# Patient Record
Sex: Female | Born: 1955 | Race: White | Hispanic: No | Marital: Married | Smoking: Current every day smoker
Health system: Southern US, Academic
[De-identification: ages and names within clinical notes are randomized; demographics above are authoritative.]

## PROBLEM LIST (undated history)

## (undated) DIAGNOSIS — J45909 Unspecified asthma, uncomplicated: Secondary | ICD-10-CM

## (undated) DIAGNOSIS — E785 Hyperlipidemia, unspecified: Secondary | ICD-10-CM

## (undated) DIAGNOSIS — I10 Essential (primary) hypertension: Secondary | ICD-10-CM

## (undated) DIAGNOSIS — E119 Type 2 diabetes mellitus without complications: Secondary | ICD-10-CM

## (undated) DIAGNOSIS — F419 Anxiety disorder, unspecified: Secondary | ICD-10-CM

## (undated) HISTORY — DX: Essential (primary) hypertension: I10

## (undated) HISTORY — DX: Type 2 diabetes mellitus without complications: E11.9

## (undated) HISTORY — DX: Anxiety disorder, unspecified: F41.9

## (undated) HISTORY — PX: HX GALL BLADDER SURGERY/CHOLE: SHX55

## (undated) HISTORY — PX: HX APPENDECTOMY: SHX54

## (undated) HISTORY — DX: Unspecified asthma, uncomplicated: J45.909

## (undated) HISTORY — DX: Hyperlipidemia, unspecified: E78.5

## (undated) HISTORY — PX: HX HYSTERECTOMY: SHX81

---

## 1996-12-23 ENCOUNTER — Other Ambulatory Visit (HOSPITAL_COMMUNITY): Payer: Self-pay | Admitting: OBSTETRICS/GYNECOLOGY

## 2021-11-05 ENCOUNTER — Ambulatory Visit (HOSPITAL_COMMUNITY): Payer: Self-pay | Admitting: OPHTHALMOLOGY

## 2022-08-27 ENCOUNTER — Other Ambulatory Visit (HOSPITAL_COMMUNITY): Payer: Self-pay | Admitting: Family Medicine

## 2022-08-27 DIAGNOSIS — Z1239 Encounter for other screening for malignant neoplasm of breast: Secondary | ICD-10-CM

## 2022-09-06 ENCOUNTER — Other Ambulatory Visit: Payer: Self-pay

## 2022-09-06 ENCOUNTER — Encounter (HOSPITAL_COMMUNITY): Payer: Self-pay

## 2022-09-06 ENCOUNTER — Inpatient Hospital Stay
Admission: RE | Admit: 2022-09-06 | Discharge: 2022-09-06 | Disposition: A | Discharge status: Home / Self Care | Payer: Medicare Other | Source: Ambulatory Visit | Attending: Family Medicine | Admitting: Family Medicine

## 2022-09-06 DIAGNOSIS — Z1239 Encounter for other screening for malignant neoplasm of breast: Secondary | ICD-10-CM

## 2022-09-06 DIAGNOSIS — Z1231 Encounter for screening mammogram for malignant neoplasm of breast: Secondary | ICD-10-CM | POA: Insufficient documentation

## 2023-01-17 IMAGING — DX XRAY SHOULDER MINIMUM 2 VIEW LT
1 series · 2 of 2 positions shown · non-contrast
Comparison: None available.

﻿EXAM:  [id number]   XRAY SHOULDER MINIMUM 2 VIEW RT,XRAY SHOULDER MINIMUM 2 VIEW LT
INDICATION: Bilateral shoulder pain.

[Series 1: apobl · 0.14mm/px · 2 of 2 slices shown]
[im 1/2]
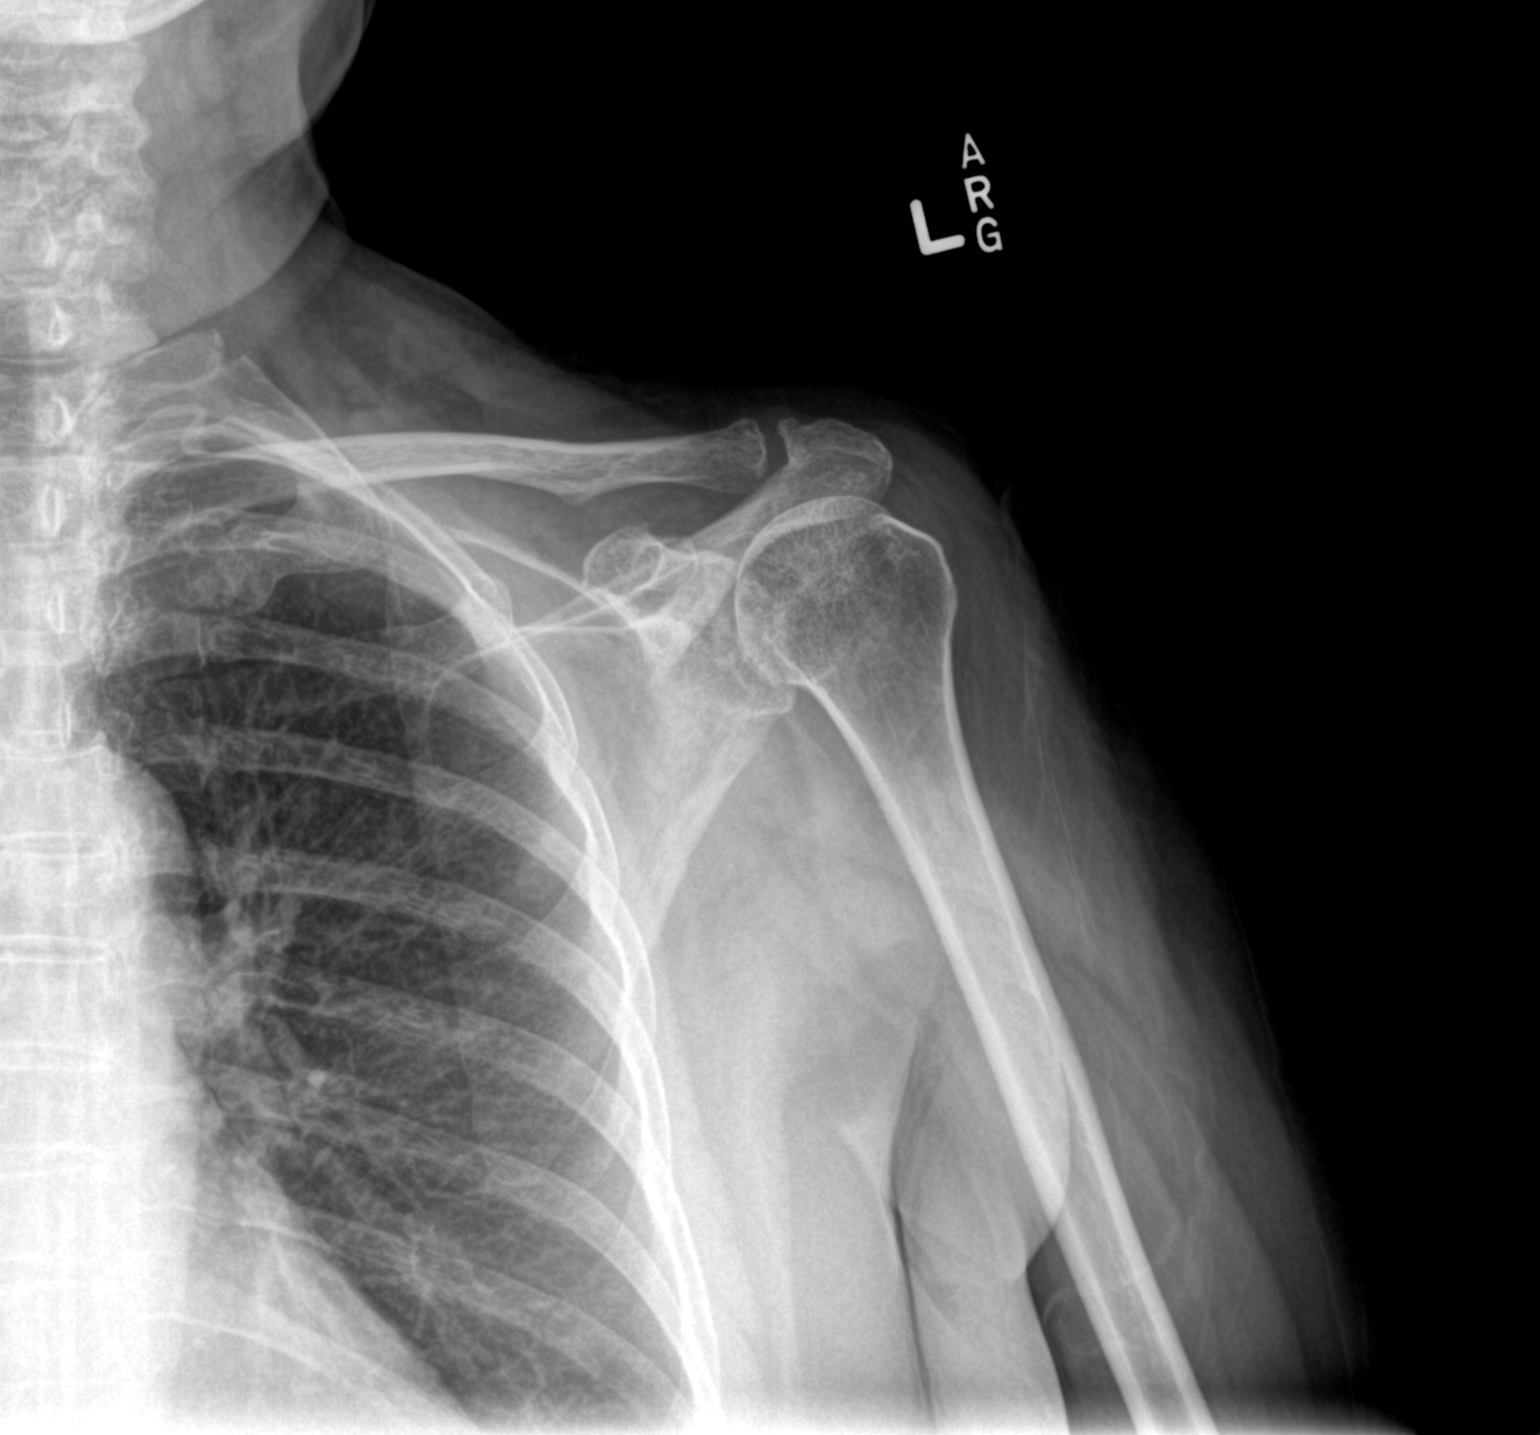
[im 2/2]
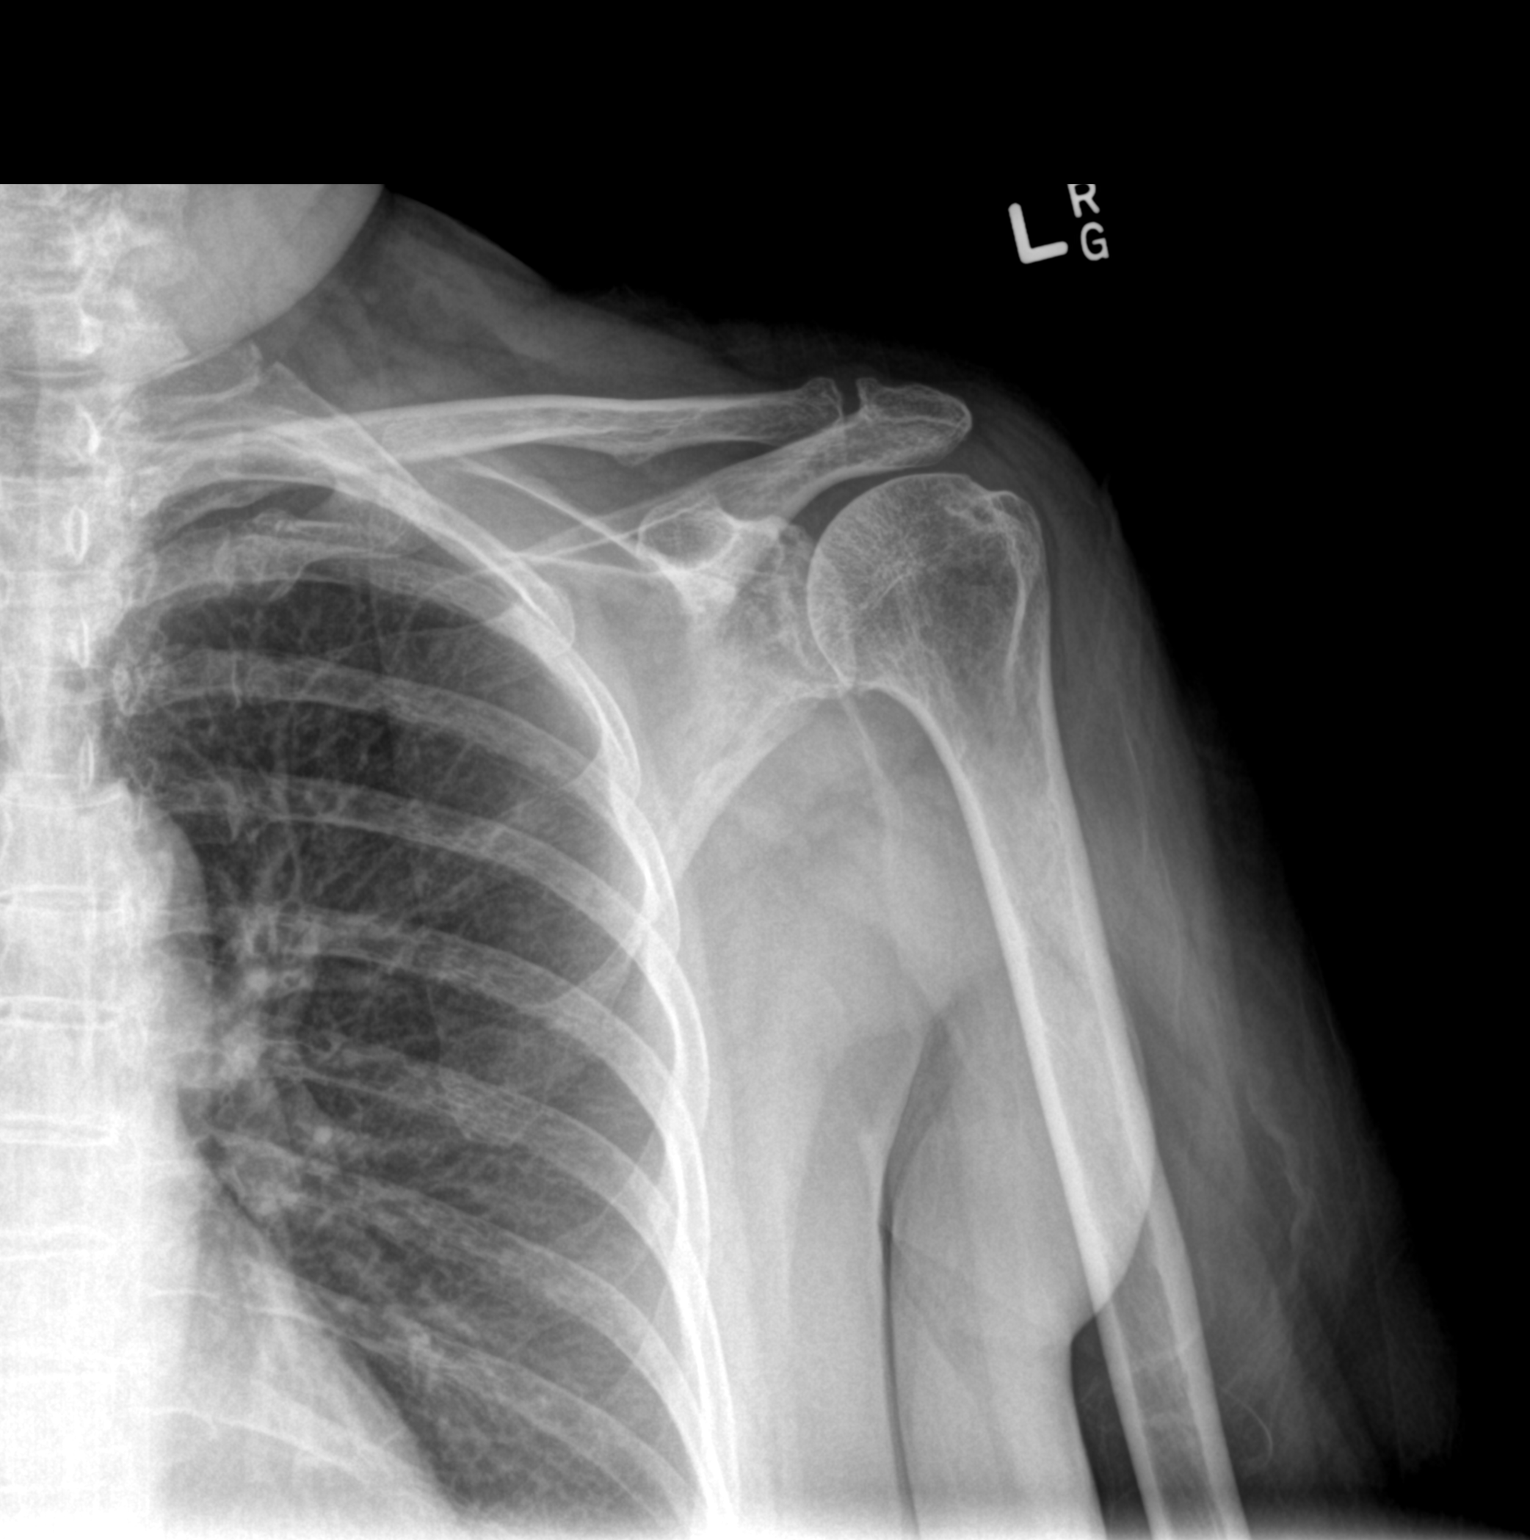

[2 of 2 positions shown; findings below may reference images not displayed]

FINDINGS: Suspected osteopenic bones.  Osteoarthritis of the glenohumeral joints of moderate degree is noted on both sides.  Mild osteoarthritis of acromioclavicular joints.  Soft tissues are normal.
IMPRESSION: 1. Suspected osteopenic bones. Please correlate with DEXA densitometry.

2. Osteoarthritis with degenerative changes of glenohumeral joints of moderately significant degree of both sides.   Mild osteoarthritis of AC joints on both sides.

## 2023-01-17 IMAGING — MR MRI SHOULDER RT W/O CONTRAST
7 of 8 series · 31 of 40 positions shown · IV contrast (gadolinium)
Comparison: Right shoulder radiograph of same date.

﻿EXAM:  [id number]   MRI SHOULDER RT W/O CONTRAST
INDICATION: Bilateral shoulder pain.  No history of shoulder surgery.
TECHNIQUE: Multiplanar multisequential MRI of the right shoulder was performed without gadolinium contrast.

[Series 6: T1 · oblique · right · 4.0mm · 0.31mm/px · 5 of 22 slices shown]
[im 1/22]
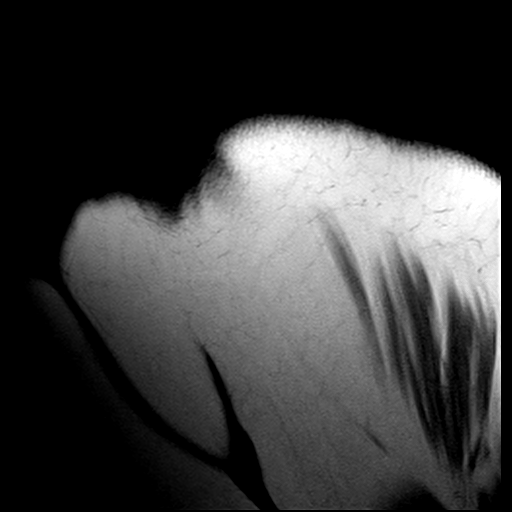
[im 6/22]
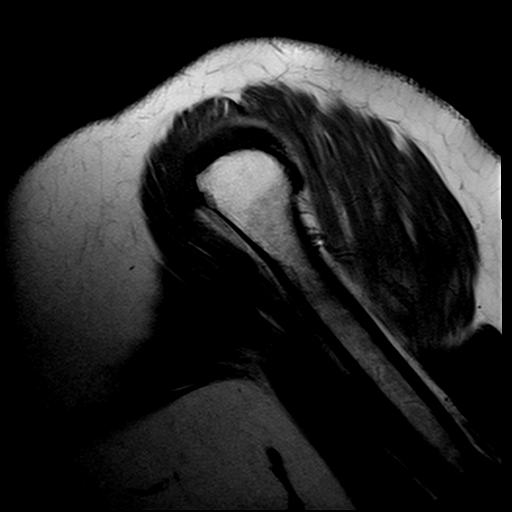
[im 11/22]
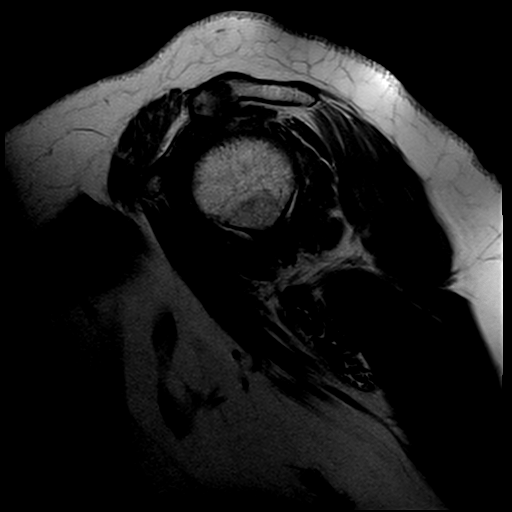
[im 16/22]
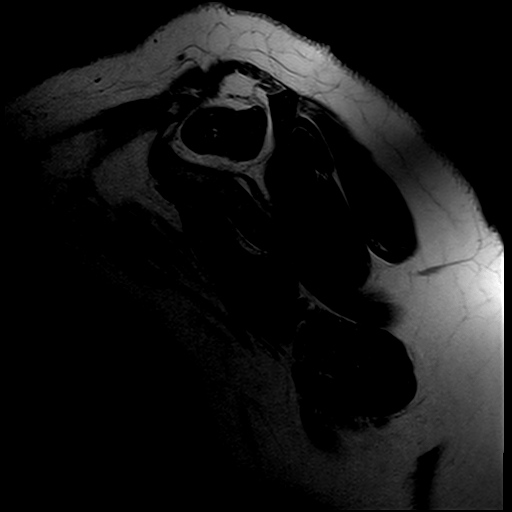
[im 22/22]
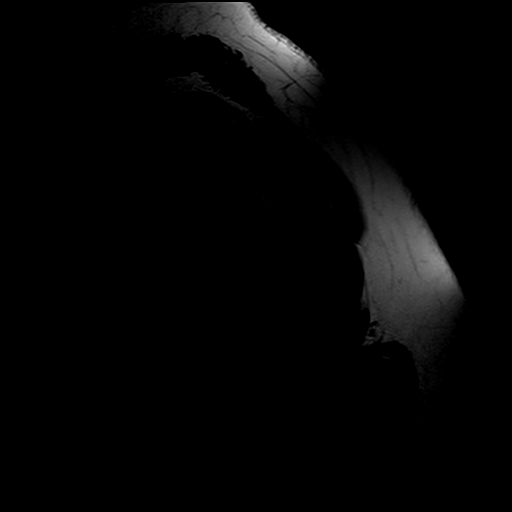

[Series 7: T2 · oblique · right · 4.0mm · 0.42mm/px · 5 of 22 slices shown]
[im 1/22]
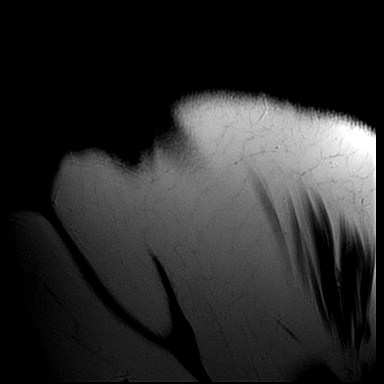
[im 6/22]
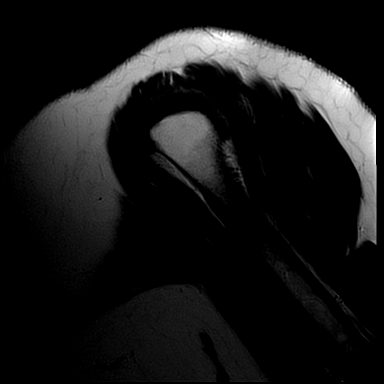
[im 11/22]
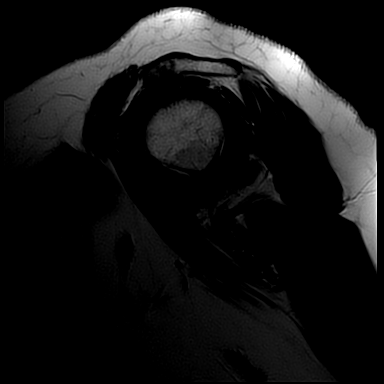
[im 16/22]
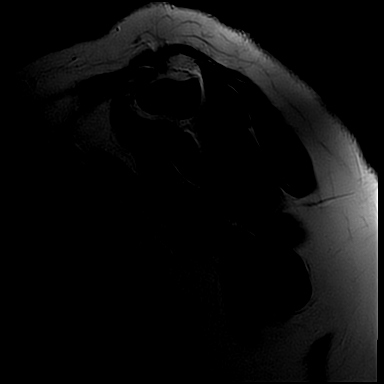
[im 22/22]
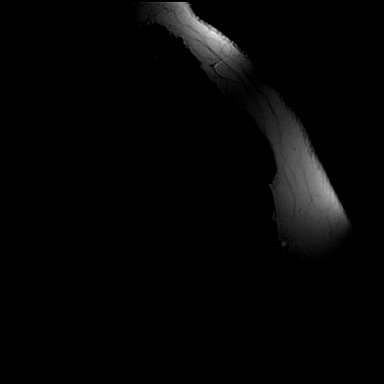

[Series 8: STIR · oblique · right · 4.0mm · 0.36mm/px · 1 of 22 slices shown]
[im 1/22]
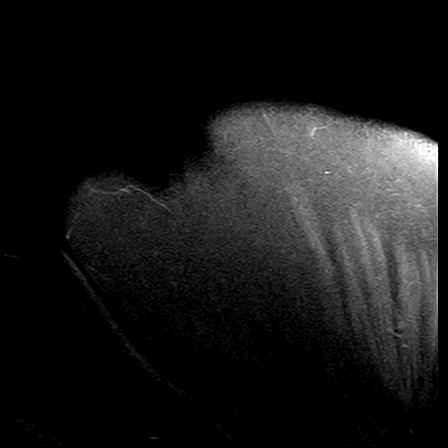

[Series 9: PD fat-sat · axial · right · 4.0mm · 0.36mm/px · z∈[-23,+87]mm · 5 of 24 slices shown (1 of 3)]
[im 1/24]
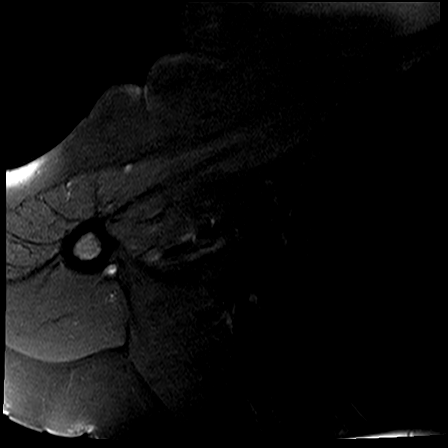
[im 6/24]
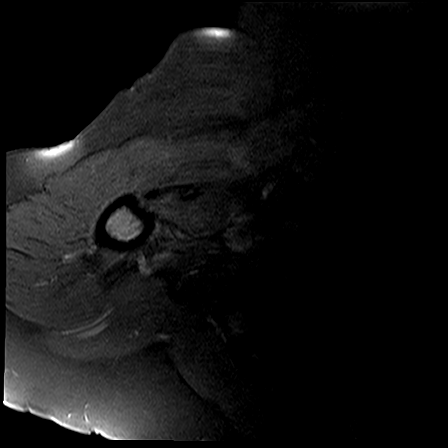
[im 12/24]
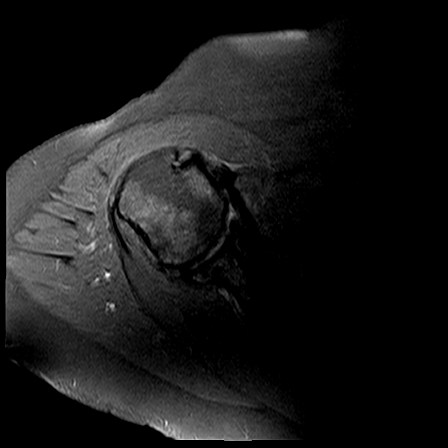
[im 18/24]
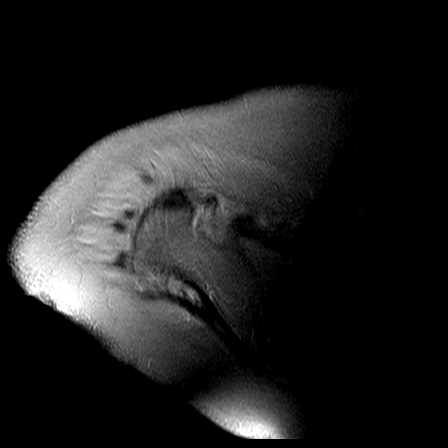
[im 24/24]
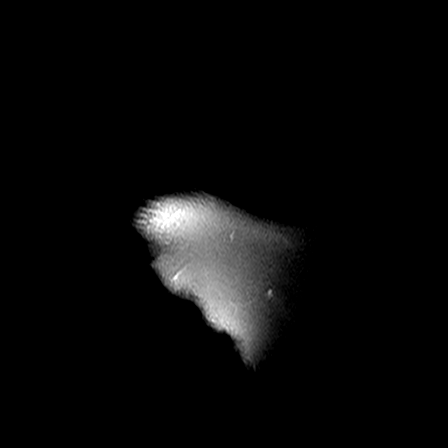

[Series 10: T2 fat-sat · axial · right · 4.0mm · 0.42mm/px · z∈[-23,+87]mm · 5 of 24 slices shown]
[im 1/24]
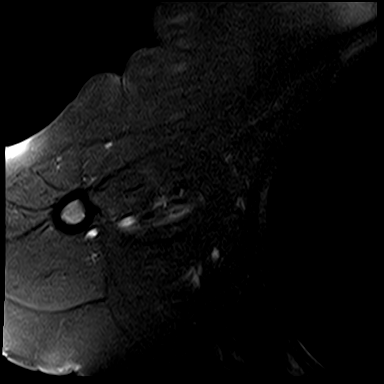
[im 6/24]
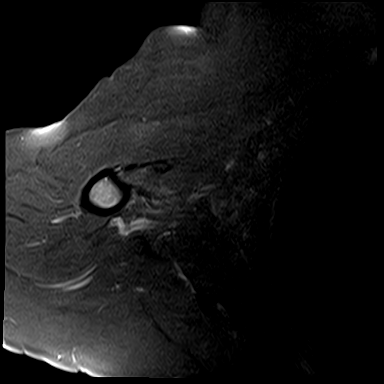
[im 12/24]
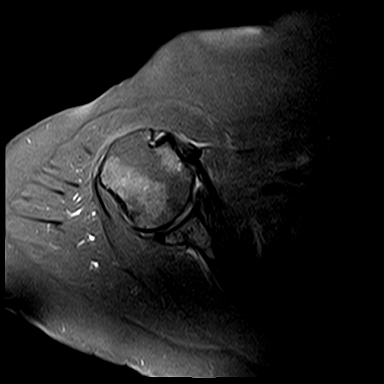
[im 18/24]
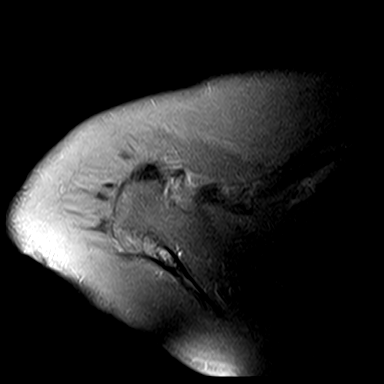
[im 24/24]
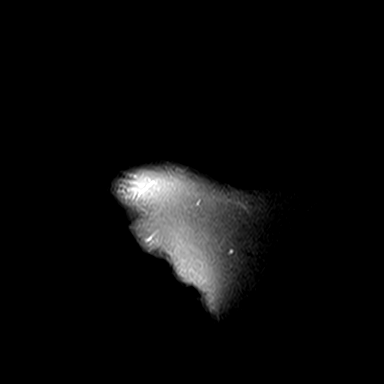

[Series 11: PD fat-sat · oblique · right · 4.0mm · 0.47mm/px · 5 of 23 slices shown (2 of 3)]
[im 1/23]
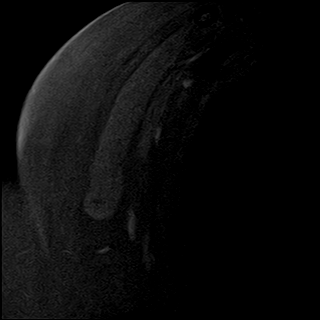
[im 6/23]
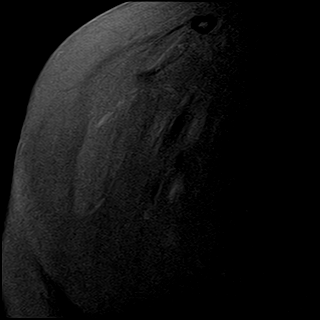
[im 12/23]
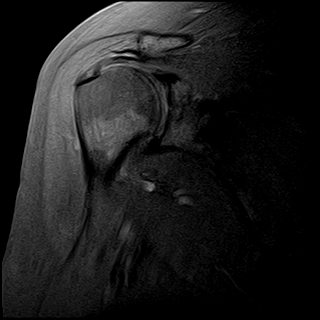
[im 17/23]
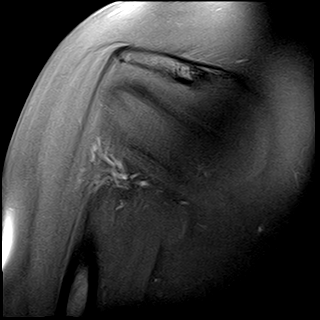
[im 23/23]
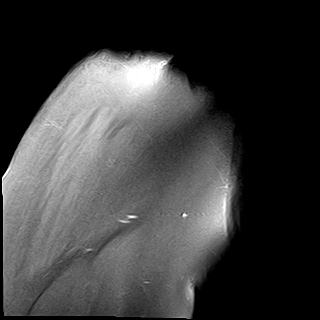

[Series 13: PD fat-sat · axial · right · 4.0mm · 0.50mm/px · z∈[-23,+87]mm · 5 of 24 slices shown (3 of 3)]
[im 1/24]
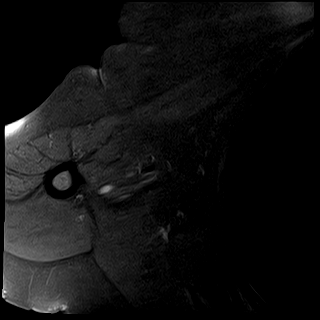
[im 6/24]
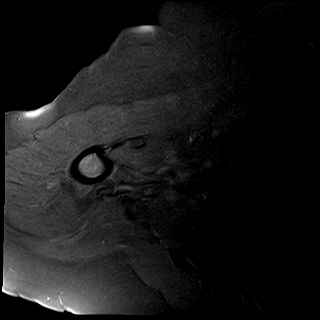
[im 12/24]
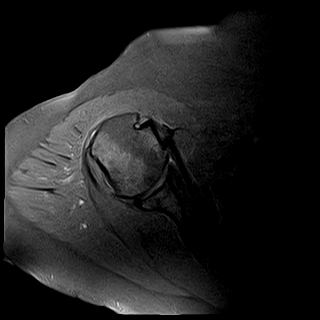
[im 18/24]
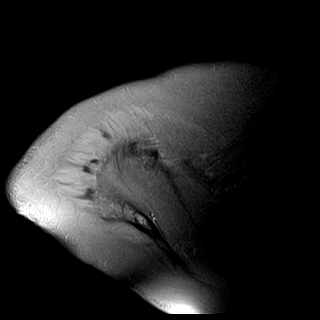
[im 24/24]
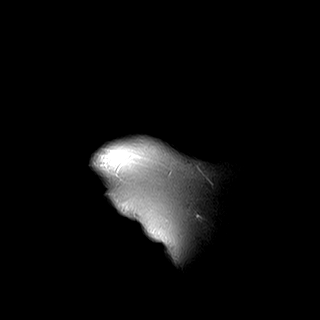

[31 of 40 positions shown; findings below may reference images not displayed]

FINDINGS: No acute bony lesions are seen at the right shoulder.  Degenerative arthritis of significant degree involving glenohumeral joint is noted with degenerative changes of glenoid labrum particularly the anterior lip of the glenoid labrum with small paralabral cyst.  No evidence of displaced tear of the labrum is seen.  

Long head of biceps tendon is intact.  

Degenerative changes of AC joint mildly impinging on subacromian space.  Mild supraspinatus tendinitis is noted.  No evidence of rotator cuff tear is noted.  A small effusion in the subacromian bursa is noted.
IMPRESSION: 1. No acute bony lesions at the right shoulder. 

2. Significant degenerative changes of glenohumeral joint with degenerative changes of glenoid labrum.  Small paralabral cyst in the anterior lip of the labrum is noted without displaced tear.  

3. Degenerative changes of AC joint mildly impinging on the subacromian space with mild supraspinatus tendinopathy. No evidence of rotator cuff tear. A small effusion in the subacromian bursa.

## 2023-01-17 IMAGING — MR MRI SHOULDER LT W/O CONTRAST
7 of 8 series · 34 of 40 positions shown · IV contrast (gadolinium)
Comparison: Radiograph left shoulder dated 01/17/2023.

﻿EXAM:  [id number]   MRI SHOULDER LT W/O CONTRAST
INDICATION: Bilateral shoulder pain. No history of surgery.
TECHNIQUE: Multiplanar multisequential MRI of the left shoulder was performed without gadolinium contrast.

[Series 4: axial shim · axial · left · 10.0mm · 3.12mm/px · z∈[-80,+40]mm · 3 of 13 slices shown (1 of 2)]
[im 1/13]
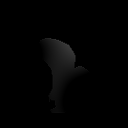
[im 7/13]
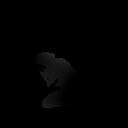
[im 13/13]
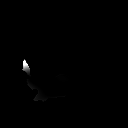

[Series 5: axial shim · axial · left · 10.0mm · 3.12mm/px · z∈[-80,+40]mm · 3 of 13 slices shown (2 of 2)]
[im 1/13]
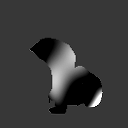
[im 7/13]
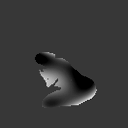
[im 13/13]
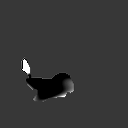

[Series 6: T1 · oblique · left · 4.0mm · 0.31mm/px · 5 of 22 slices shown]
[im 1/22]
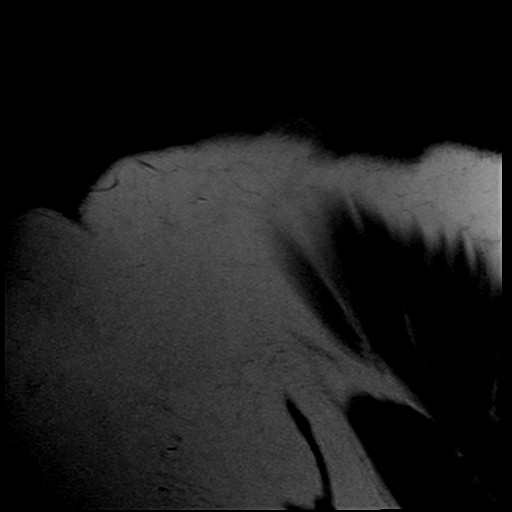
[im 6/22]
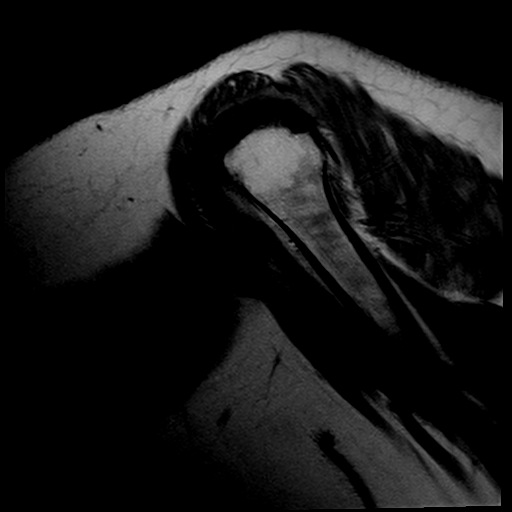
[im 11/22]
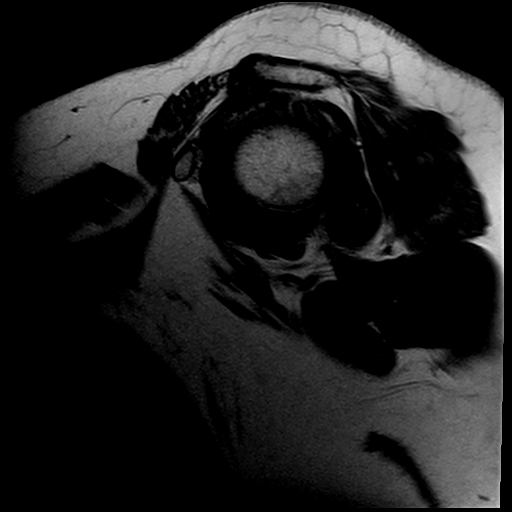
[im 16/22]
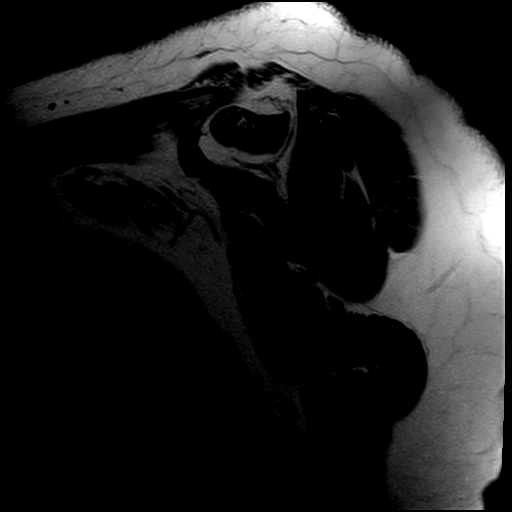
[im 22/22]
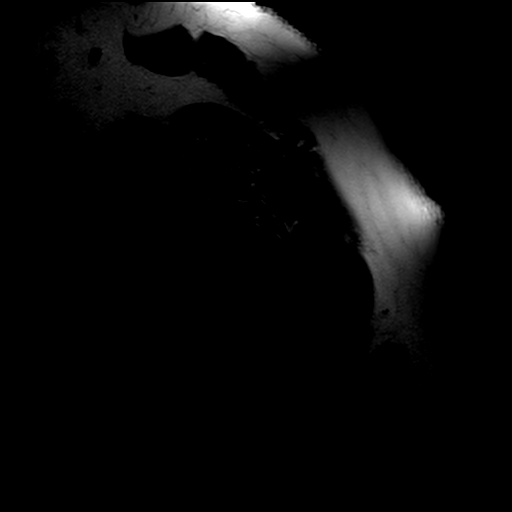

[Series 7: STIR · oblique · left · 4.0mm · 0.36mm/px · 5 of 22 slices shown]
[im 1/22]
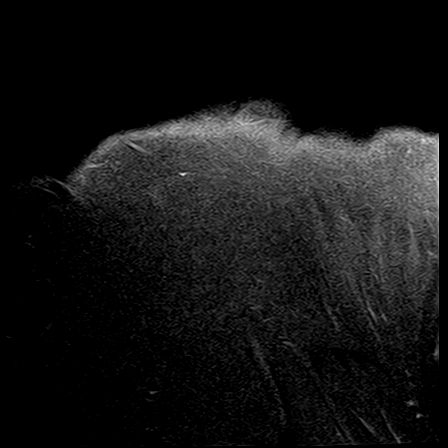
[im 6/22]
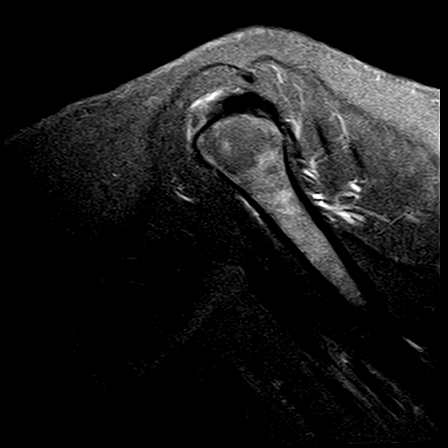
[im 11/22]
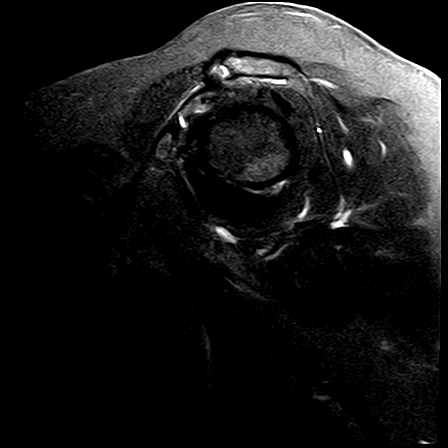
[im 16/22]
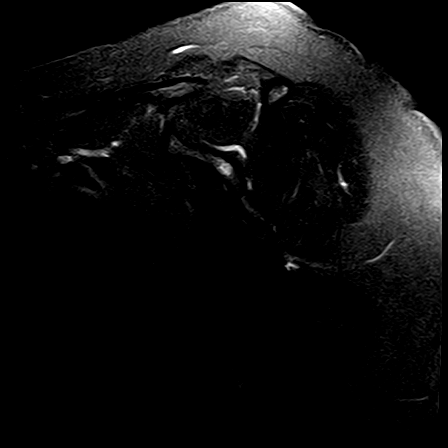
[im 22/22]
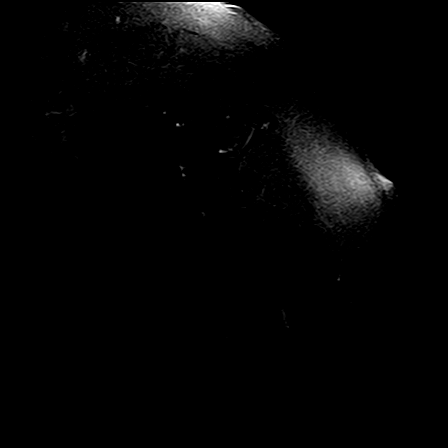

[Series 8: PD fat-sat · axial · left · 4.0mm · 0.36mm/px · z∈[-76,+34]mm · 6 of 24 slices shown (1 of 2)]
[im 1/24]
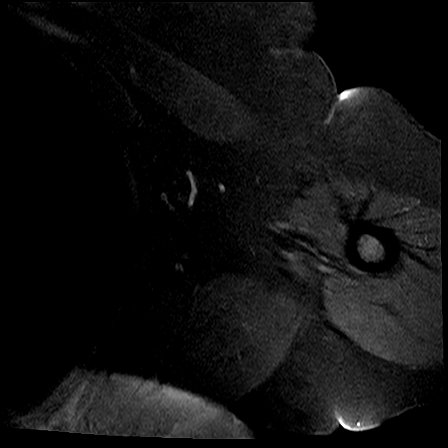
[im 5/24]
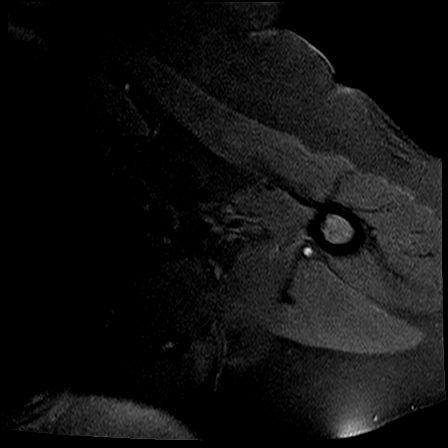
[im 10/24]
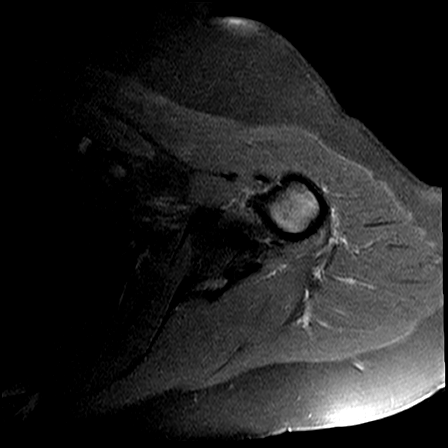
[im 14/24]
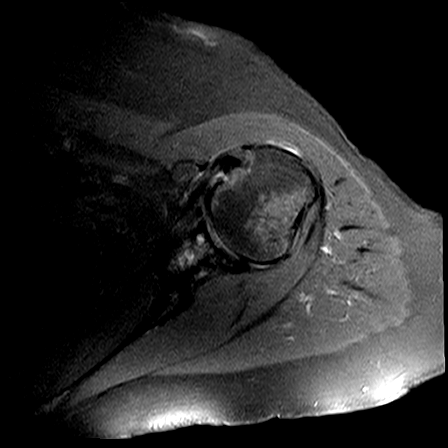
[im 19/24]
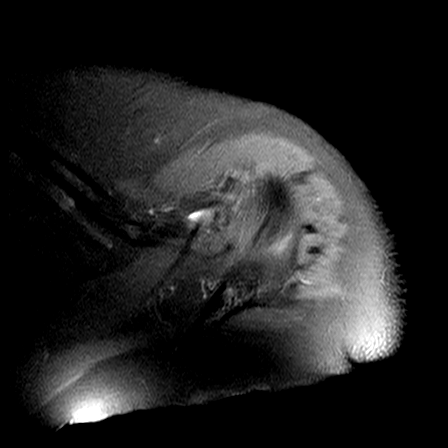
[im 24/24]
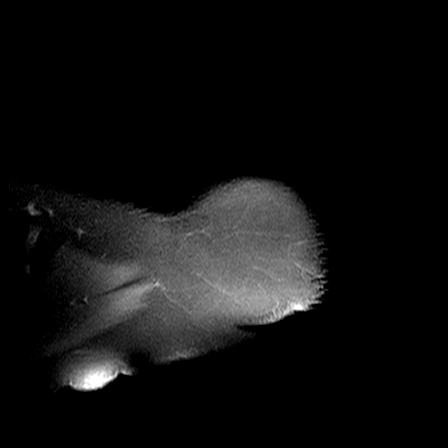

[Series 9: T2 fat-sat · axial · left · 4.0mm · 0.42mm/px · z∈[-76,+34]mm · 6 of 24 slices shown]
[im 1/24]
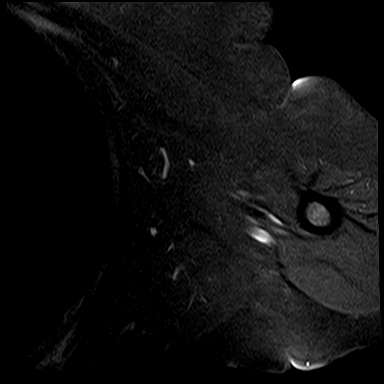
[im 5/24]
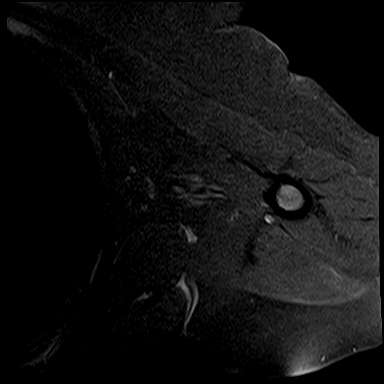
[im 10/24]
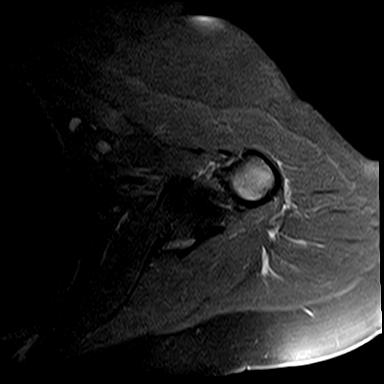
[im 14/24]
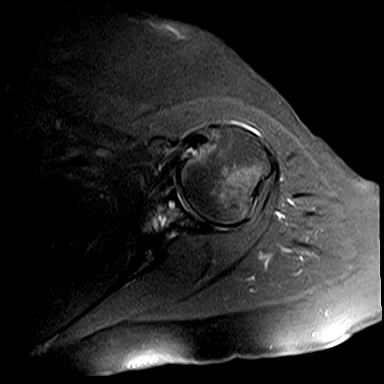
[im 19/24]
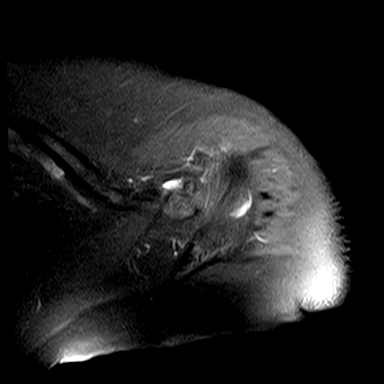
[im 24/24]
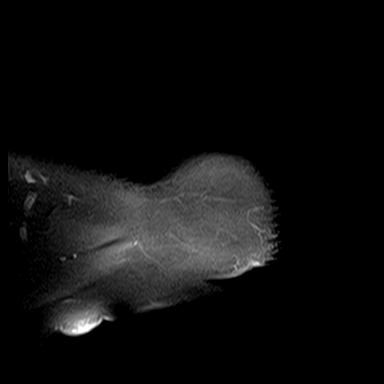

[Series 10: PD fat-sat · oblique · left · 4.0mm · 0.47mm/px · 6 of 23 slices shown (2 of 2)]
[im 1/23]
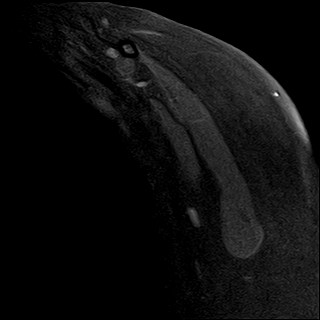
[im 5/23]
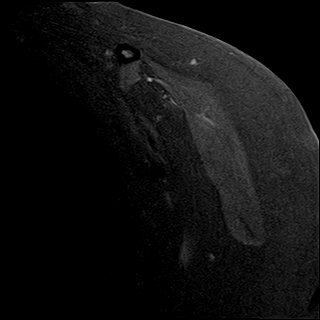
[im 9/23]
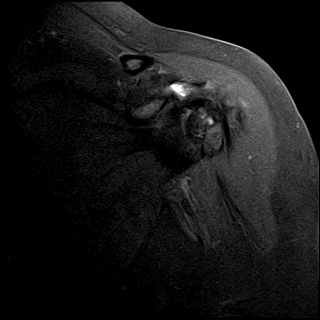
[im 14/23]
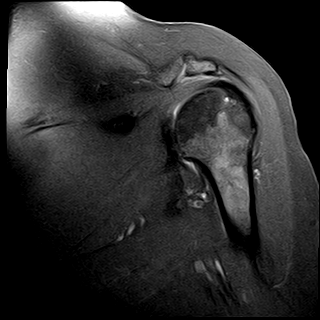
[im 18/23]
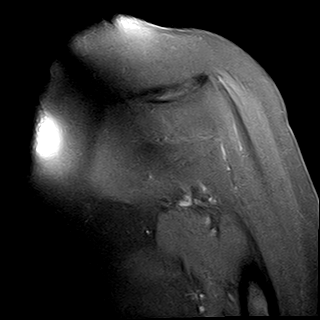
[im 23/23]
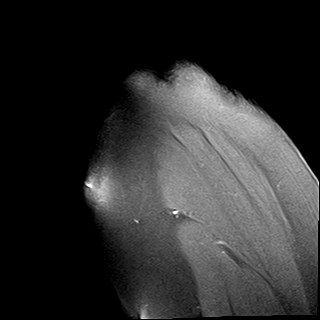

[34 of 40 positions shown; findings below may reference images not displayed]

FINDINGS: No acute bony lesions are seen at the left shoulder.  Osteoarthritis of AC joint is noted mildly impinging on the subacromian space.  Mild supraspinatus tendinitis with small effusion in the subacromian bursa.  No evidence of rotator cuff tear.  

Significant degenerative changes of glenohumeral joint are noted with degenerative changes of glenoid labrum.  Small 3-4 mm paralabral cyst is noted at the superior lip of the labrum without evidence of labral tear.  Long head of biceps tendon is intact.
IMPRESSION: 1. Significant degenerative changes of glenohumeral joint with degenerative changes of glenoid labrum.  Small paralabral cysts are noted.  No displaced labral tears are noted. 

2. Osteoarthritis of AC joint mildly impinging on subacromian space with supraspinatus tendinitis.  No evidence of rotator cuff tear.  A small effusion in the subacromian bursa at the left shoulder.

## 2023-01-17 IMAGING — DX XRAY SHOULDER MINIMUM 2 VIEW RT
1 series · 2 of 2 positions shown · non-contrast
Comparison: None available.

﻿EXAM:  [id number]   XRAY SHOULDER MINIMUM 2 VIEW RT,XRAY SHOULDER MINIMUM 2 VIEW LT
INDICATION: Bilateral shoulder pain.

[Series 1: apobl · 0.14mm/px · 2 of 2 slices shown]
[im 1/2]
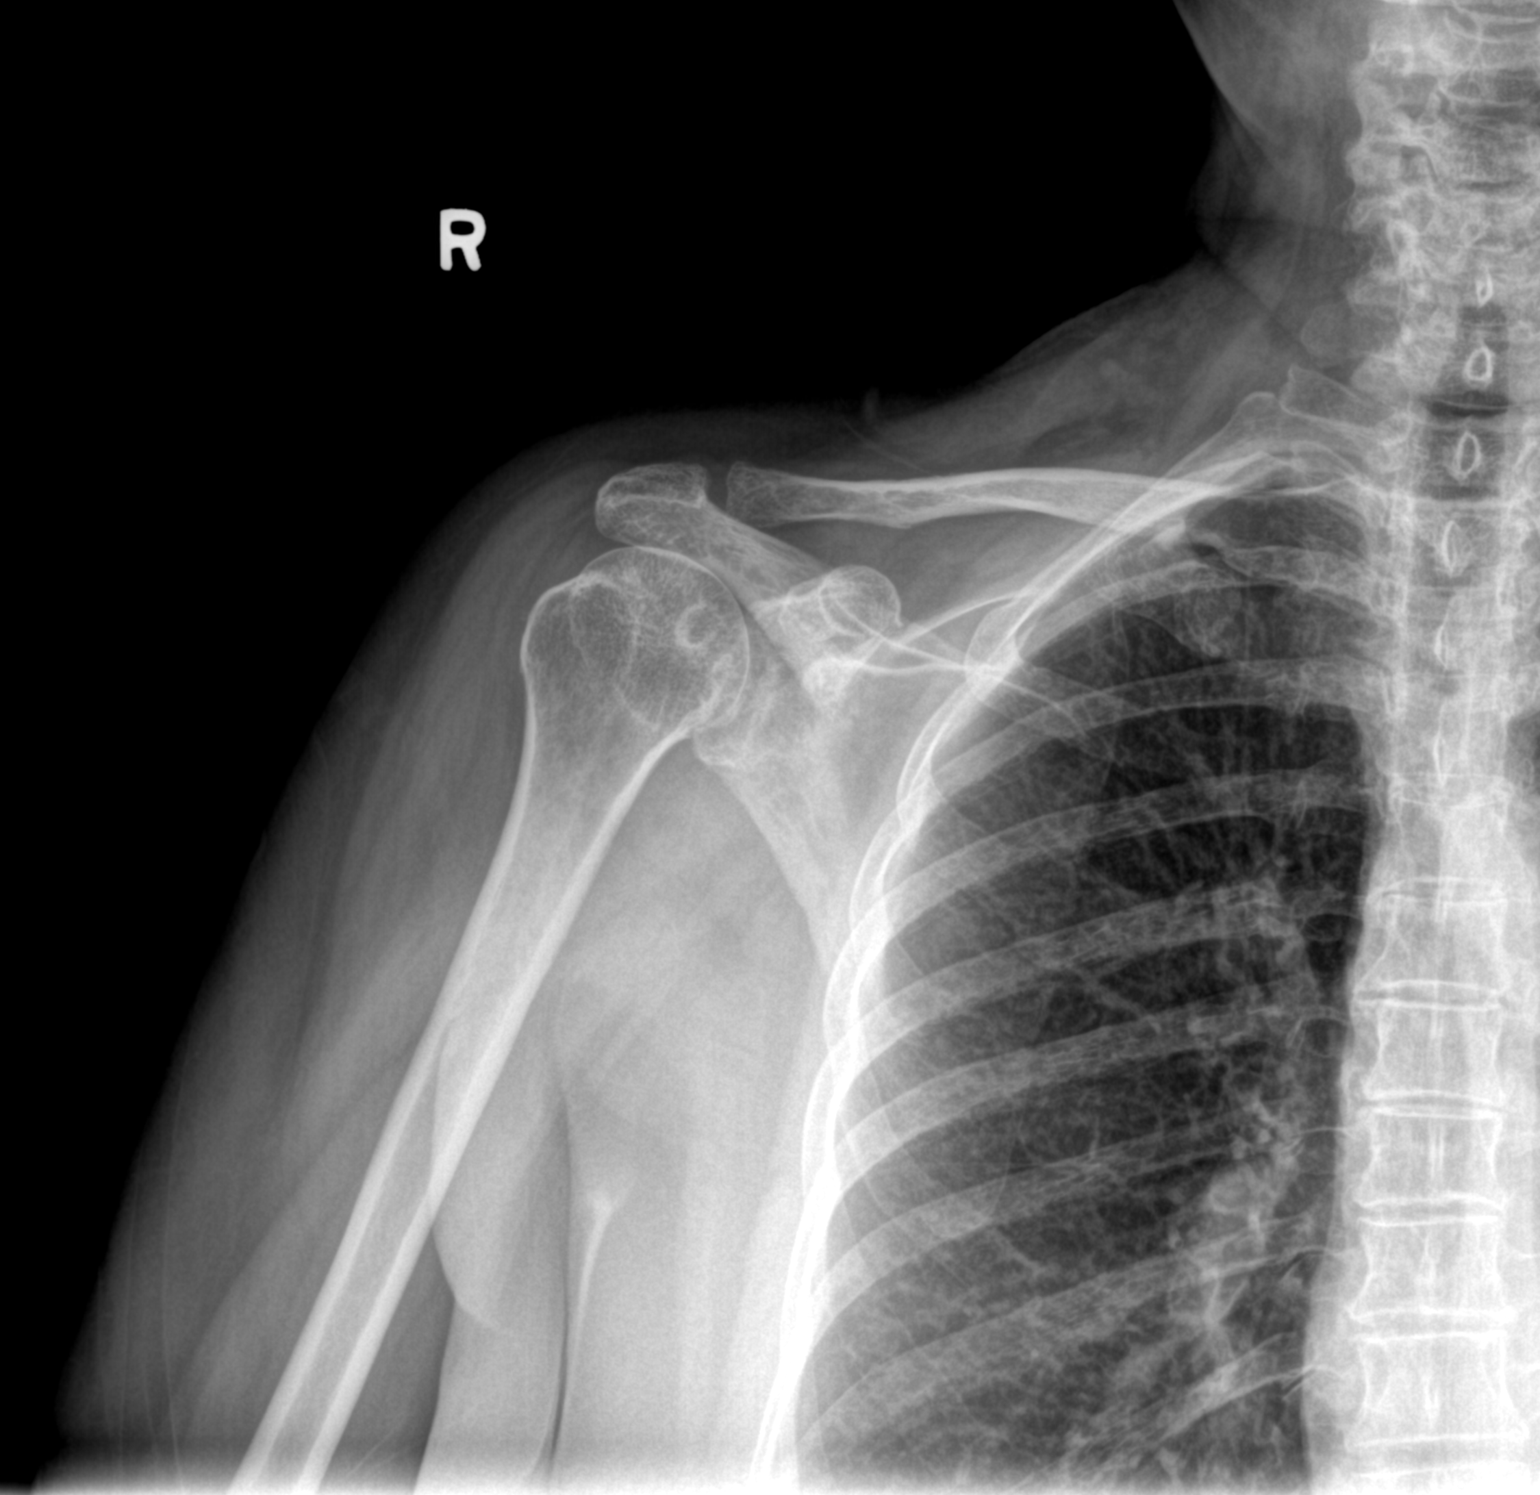
[im 2/2]
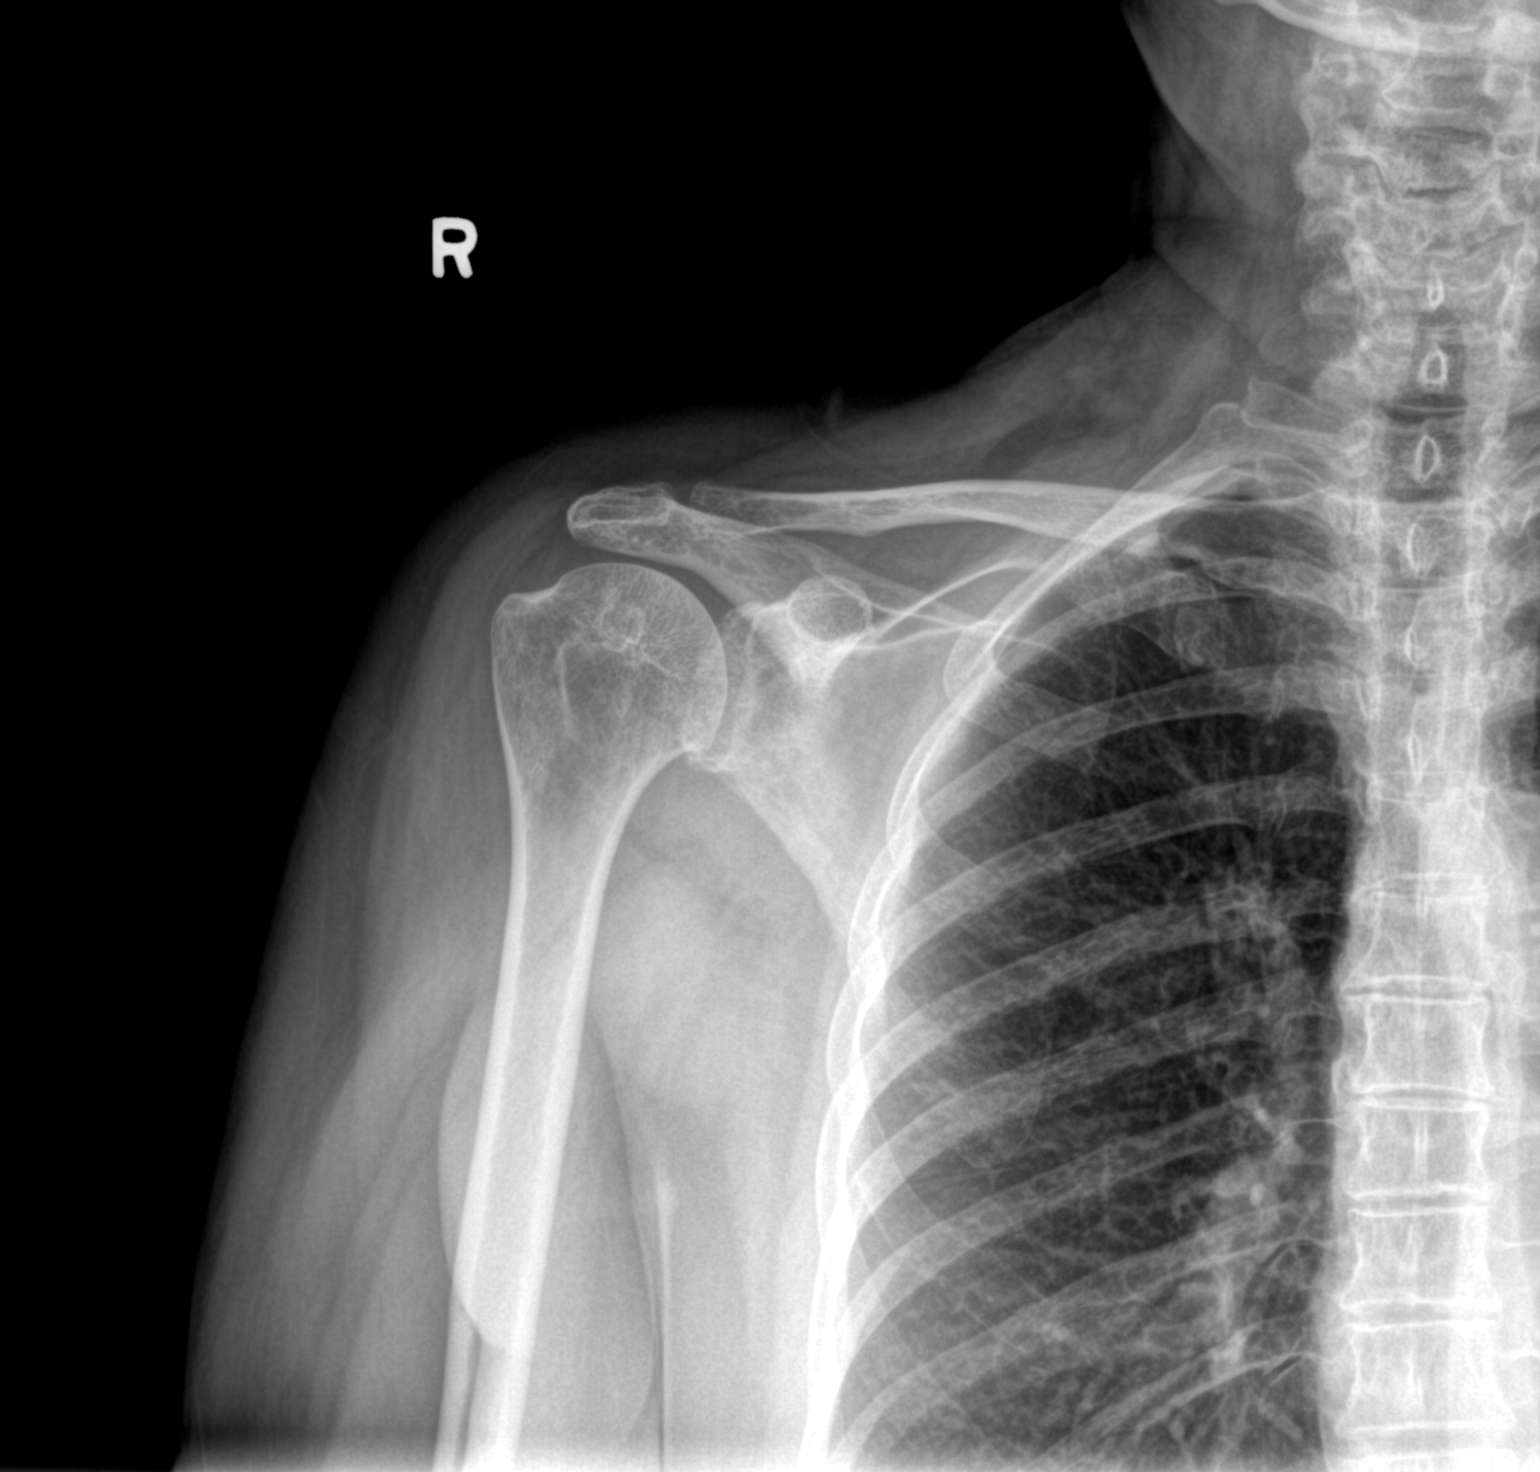

[2 of 2 positions shown; findings below may reference images not displayed]

FINDINGS: Suspected osteopenic bones.  Osteoarthritis of the glenohumeral joints of moderate degree is noted on both sides.  Mild osteoarthritis of acromioclavicular joints.  Soft tissues are normal.
IMPRESSION: 1. Suspected osteopenic bones. Please correlate with DEXA densitometry.

2. Osteoarthritis with degenerative changes of glenohumeral joints of moderately significant degree of both sides.   Mild osteoarthritis of AC joints on both sides.

## 2023-03-07 ENCOUNTER — Encounter (INDEPENDENT_AMBULATORY_CARE_PROVIDER_SITE_OTHER): Payer: Self-pay | Admitting: Surgery

## 2023-03-10 ENCOUNTER — Ambulatory Visit (INDEPENDENT_AMBULATORY_CARE_PROVIDER_SITE_OTHER): Payer: Medicare Other | Admitting: Surgery

## 2023-03-10 ENCOUNTER — Encounter (INDEPENDENT_AMBULATORY_CARE_PROVIDER_SITE_OTHER): Payer: Self-pay | Admitting: Surgery

## 2023-03-10 ENCOUNTER — Other Ambulatory Visit: Payer: Self-pay

## 2023-03-10 VITALS — BP 146/76 | HR 98 | Temp 98.2°F | Ht 61.0 in | Wt 168.2 lb

## 2023-03-10 DIAGNOSIS — Z1211 Encounter for screening for malignant neoplasm of colon: Secondary | ICD-10-CM

## 2023-03-10 MED ORDER — PEG 3350-ELECTROLYTES 236 GRAM-22.74 GRAM-6.74 GRAM-5.86 GRAM SOLUTION
4.0000 L | Freq: Once | ORAL | 0 refills | Status: AC
Start: 2023-03-10 — End: 2023-03-10

## 2023-03-10 NOTE — H&P (Signed)
GENERAL SURGERY, [facility]  [address]  [place] [address]       Name: Melanie Ortega MRN:  [id number]   Date: 03/10/2023 Age: 67 y.o. [birth year]      PCP: Jacalyn Lefevre, MD     Subjective  Melanie Ortega is a 67 y.o. year old female who presents for screening colonoscopy.  No current GI complaints.  No constipation.  No abdominal pain.    No unexplained weight loss.      Family history of colon cancer:  None    Last colonoscopy:   2015 reportedly with colorectal polyps    Patient's referral to this office included a recent assessment by the referring provider.  This was reviewed by me for this unique office visit for the indication and intent of the referral as well as any pertinent medical or surgical history relevant to the patient's independent evaluation by me today.     Allergies   Allergen Reactions    Aspirin      Blood clot?    Morphine      Nausea    Oxycodone-Acetaminophen      Other Reaction(s): Nausea Only    Prednisone      Other Reaction(s): Other - See Comments    Make her "flippy"    Propoxyphene N-Acetaminophen      Nausea      Current Outpatient Medications   Medication Sig    albuterol sulfate (VENTOLIN HFA) 90 mcg/actuation Inhalation oral inhaler Take 1 Puff by inhalation    clonazePAM (KLONOPIN) 0.5 mg Oral Tablet Take 1 Tablet (0.5 mg total) by mouth    DULoxetine (CYMBALTA DR) 60 mg Oral Capsule, Delayed Release(E.C.) Take 1 Capsule (60 mg total) by mouth    furosemide (LASIX) 40 mg Oral Tablet Take 1 Tablet (40 mg total) by mouth    hydrOXYzine pamoate (VISTARIL) 25 mg Oral Capsule Take 1 Capsule (25 mg total) by mouth    meloxicam (MOBIC) 15 mg Oral Tablet Take 1 Tablet (15 mg total) by mouth    metFORMIN (GLUCOPHAGE) 500 mg Oral Tablet Take 1 Tablet (500 mg total) by mouth    olmesartan (BENICAR) 40 mg Oral Tablet Take 1 Tablet (40 mg total) by mouth    omeprazole (PRILOSEC) 40 mg Oral Capsule, Delayed Release(E.C.) Take 1 Capsule (40 mg total) by  mouth    PEG 3350-Electrolytes 236-22.74-6.74 -5.86 gram Oral Recon Soln Take 4,000 mL by mouth One time for 1 dose    potassium chloride (K-DUR) 20 mEq Oral Tab Sust.Rel. Particle/Crystal Take 1 Tablet (20 mEq total) by mouth    rosuvastatin (CRESTOR) 10 mg Oral Tablet Take 1 Tablet (10 mg total) by mouth          Objective:       Vitals:    03/10/23 1453   BP: (!) 146/76   Pulse: 98   Temp: 36.8 C (98.2 F)   SpO2: 93%   Weight: 76.3 kg (168 lb 3.2 oz)   Height: 1.549 m (5\' 1" )   BMI: 31.85        General: appropriate for age. in no acute distress.    Vital signs are present above and have been reviewed by me     HEENT: Atraumatic, Normocephalic.    Lungs: Nonlabored breathing with symmetric expansion    Heart:Regular wth respect to rate     Abdomen:Soft. Nontender. Nondistended     Psychiatric: Alert and oriented to person,  place, and time. affect appropriate    Assessment/Plan    ICD-10-CM    1. Encounter for screening colonoscopy  Z12.11            Discussed indications, risks, and benefits of colonoscopy with the patient.  Discussed the possibility of polypectomy, biopsies, and repeat possible examinations.  Risks discussed include bleeding, sedation risks, possibility of missed diagnosis of polyp malignancy, and remote possibility of perforation and/or death.  All questions answered and informed consent clearly obtained.    Office Visit was used for detailed explanation procedure and its indications, review of the patient's medications relative to the time before and after the procedure, and the effects of the associated medical conditions that affect the procedure preparation and procedure itself.    Maura Crandall MD MBA CPE FACS

## 2023-04-10 ENCOUNTER — Other Ambulatory Visit: Payer: Self-pay

## 2023-04-10 ENCOUNTER — Ambulatory Visit (HOSPITAL_COMMUNITY): Payer: Medicare Other | Admitting: Certified Registered"

## 2023-04-10 ENCOUNTER — Encounter (HOSPITAL_COMMUNITY)
Admission: RE | Disposition: A | Discharge status: Home / Self Care | Payer: Self-pay | Source: Home / Self Care | Attending: Surgery

## 2023-04-10 ENCOUNTER — Encounter (HOSPITAL_COMMUNITY): Payer: Self-pay | Admitting: Surgery

## 2023-04-10 ENCOUNTER — Inpatient Hospital Stay
Admission: RE | Admit: 2023-04-10 | Discharge: 2023-04-10 | Disposition: A | Discharge status: Home / Self Care | Payer: Medicare Other | Attending: Surgery | Admitting: Surgery

## 2023-04-10 ENCOUNTER — Encounter (HOSPITAL_COMMUNITY): Payer: Medicare Other | Admitting: Surgery

## 2023-04-10 DIAGNOSIS — Z1211 Encounter for screening for malignant neoplasm of colon: Secondary | ICD-10-CM | POA: Insufficient documentation

## 2023-04-10 DIAGNOSIS — E119 Type 2 diabetes mellitus without complications: Secondary | ICD-10-CM | POA: Insufficient documentation

## 2023-04-10 DIAGNOSIS — Q438 Other specified congenital malformations of intestine: Secondary | ICD-10-CM | POA: Insufficient documentation

## 2023-04-10 DIAGNOSIS — J45909 Unspecified asthma, uncomplicated: Secondary | ICD-10-CM | POA: Insufficient documentation

## 2023-04-10 DIAGNOSIS — M797 Fibromyalgia: Secondary | ICD-10-CM | POA: Insufficient documentation

## 2023-04-10 DIAGNOSIS — E669 Obesity, unspecified: Secondary | ICD-10-CM | POA: Insufficient documentation

## 2023-04-10 DIAGNOSIS — F17210 Nicotine dependence, cigarettes, uncomplicated: Secondary | ICD-10-CM | POA: Insufficient documentation

## 2023-04-10 DIAGNOSIS — Z6832 Body mass index (BMI) 32.0-32.9, adult: Secondary | ICD-10-CM | POA: Insufficient documentation

## 2023-04-10 DIAGNOSIS — Z86010 Personal history of colonic polyps: Secondary | ICD-10-CM | POA: Insufficient documentation

## 2023-04-10 DIAGNOSIS — F419 Anxiety disorder, unspecified: Secondary | ICD-10-CM | POA: Insufficient documentation

## 2023-04-10 DIAGNOSIS — E785 Hyperlipidemia, unspecified: Secondary | ICD-10-CM | POA: Insufficient documentation

## 2023-04-10 DIAGNOSIS — K648 Other hemorrhoids: Secondary | ICD-10-CM | POA: Insufficient documentation

## 2023-04-10 DIAGNOSIS — Z9981 Dependence on supplemental oxygen: Secondary | ICD-10-CM | POA: Insufficient documentation

## 2023-04-10 DIAGNOSIS — K7581 Nonalcoholic steatohepatitis (NASH): Secondary | ICD-10-CM | POA: Insufficient documentation

## 2023-04-10 DIAGNOSIS — K769 Liver disease, unspecified: Secondary | ICD-10-CM | POA: Insufficient documentation

## 2023-04-10 DIAGNOSIS — Z79899 Other long term (current) drug therapy: Secondary | ICD-10-CM | POA: Insufficient documentation

## 2023-04-10 SURGERY — COLONOSCOPY
Anesthesia: General | Wound class: Clean Contaminated Wounds-The respiratory, GI, Genital, or urinary

## 2023-04-10 MED ORDER — SODIUM CHLORIDE 0.9 % INTRAVENOUS SOLUTION
INTRAVENOUS | Status: DC | PRN
Start: 2023-04-10 — End: 2023-04-10

## 2023-04-10 MED ORDER — PROPOFOL 10 MG/ML INTRAVENOUS EMULSION
Freq: Once | INTRAVENOUS | Status: DC | PRN
Start: 2023-04-10 — End: 2023-04-10
  Administered 2023-04-10: 25 mL via INTRAVENOUS

## 2023-04-10 SURGICAL SUPPLY — 1 items: DETERGENT INSTR 22OZ TRNSPT GEL RINSE FREE NEUT PH PREKLENZ CLR PLSNT LF (MISCELLANEOUS PT CARE ITEMS) ×1 IMPLANT

## 2023-04-10 NOTE — OR Surgeon (Signed)
[facility]      Patient Name: Steven, Placeres Number: [id number]  Date of Service: 04/10/2023   Date of Birth: [birth year]      Pre-Operative Diagnosis: Colon Screening     Post-Operative Diagnosis: Suboptimal Prep  Redundant Colon  Internal Hemorrhiods    Procedure(s)/Description:  COLONOSCOPY: 54098 (CPT)     Attending Surgeon: Esmond Plants, MD     Anesthesia:  CRNA: Welton Flakes, CRNA    Anesthesia Type: .General     Specimen: none    Patient was taken to the endoscopy suite and given appropriate intravenous sedation.  Videocolonoscope was inserted into the rectum and advanced sequentially to the level of the cecum.  Cecum was confirmed by external palpation, presence of the ileocecal valve and transillumination of the light.  Picture was taken that documents this level.  Patient with significant redundancy that was encountered to obtain this level.  This required positional changes and external pressure as part of the procedure.   Patient's preparation was suboptimal with liquid stool at various locations throughout the colonic lumen.  This was irrigated clear to the best of the ability, but limitations of the prep could preclude visualization of small mucosal abnormalities and/or polyps.   Colonoscope was then subsequently withdrawn back inspecting all mucosal surfaces.  The ascending colon, transverse colon, descending colon, and sigmoid colon were all visualized with no specific abnormality.  The scope was withdrawn back to the level of the rectum and retroflexed.  The rectal anal junction visualized with internal hemorrhoids and no other specific abnormality.  Scope was then subsequently straightened and withdrawn.  This concluded the procedure which patient tolerated well.    Maura Crandall MD MBA CPE FACS

## 2023-04-10 NOTE — H&P (Signed)
[place] [facility]  [address]  [place] [address]       Name: Melanie Ortega MRN:  [id number]   Date: 03/10/2023 Age: 67 y.o. [birth year]      PCP: Jacalyn Lefevre, MD     Subjective  Melanie Ortega is a 67 y.o. year old female who presents for screening colonoscopy.  No current GI complaints.  No constipation.  No abdominal pain.    No unexplained weight loss.      Family history of colon cancer:  None    Last colonoscopy:   2015 reportedly with colorectal polyps       Allergies   Allergen Reactions    Aspirin      Blood clot?    Morphine      Nausea    Oxycodone-Acetaminophen      Other Reaction(s): Nausea Only    Prednisone      Other Reaction(s): Other - See Comments    Make her "flippy"    Propoxyphene N-Acetaminophen      Nausea      No current outpatient medications on file.          Objective:       Vitals:    04/10/23 1058 04/10/23 1330   BP: 128/66 (!) 104/56   Pulse: 93 86   Resp: 16 14   Temp: 36.4 C (97.6 F) 36.4 C (97.6 F)   SpO2: 96% 100%   Weight: 74.8 kg (165 lb)    Height: 1.524 m (5')    BMI: 32.29         General: appropriate for age. in no acute distress.    Vital signs are present above and have been reviewed by me     HEENT: Atraumatic, Normocephalic.    Lungs: Nonlabored breathing with symmetric expansion    Heart:Regular wth respect to rate     Abdomen:Soft. Nontender. Nondistended     Psychiatric: Alert and oriented to person, place, and time. affect appropriate    Discussed indications, risks, and benefits of colonoscopy with the patient.  Discussed the possibility of polypectomy, biopsies, and repeat possible examinations.  Risks discussed include bleeding, sedation risks, possibility of missed diagnosis of polyp malignancy, and remote possibility of perforation and/or death.  All questions answered and informed consent clearly obtained.      Maura Crandall MD MBA CPE FACS

## 2023-04-10 NOTE — Discharge Instructions (Signed)
SURGICAL DISCHARGE INSTRUCTIONS     Dr. Esmond Plants, MD  performed your COLONOSCOPY today at the [facility]    [facility]:  Monday through Friday from 8 a.m. - 4 p.m.: [phone]    For T&D: [phone]  Between 4 p.m. - 8 a.m., weekends and holidays:  Call ER [phone]    PLEASE SEE WRITTEN HANDOUTS AS DISCUSSED BY YOUR NURSE:  Micalah Cabezas RN    SIGNS AND SYMPTOMS OF A WOUND / INCISION INFECTION   Be sure to watch for the following:  Increase in redness or red streaks near or around the wound or incision.  Increase in pain that is intense or severe and cannot be relieved by the pain medication that your doctor has given you.  Increase in swelling that cannot be relieved by elevation of a body part, or by applying ice, if permitted.  Increase in drainage, or if yellow / green in color and smells bad. This could be on a dressing or a cast.  Increase in fever for longer than 24 hours, or an increase that is higher than 101 degrees Fahrenheit (normal body temperature is 98 degrees Fahrenheit). The incision may feel warm to the touch.    **CALL YOUR DOCTOR IF ONE OR MORE OF THESE SIGNS / SYMPTOMS SHOULD OCCUR.    ANESTHESIA INFORMATION   ANESTHESIA -- ADULT PATIENTS:  You have received intravenous sedation / general anesthesia, and you may feel drowsy and light-headed for several hours. You may even experience some forgetfulness of the procedure. DO NOT DRIVE A MOTOR VEHICLE or perform any activity requiring complete alertness or coordination until you feel fully awake in about 24-48 hours. Do not drink alcoholic beverages for at least 24 hours. Do not stay alone, you must have a responsible adult available to be with you. You may also experience a dry mouth or nausea for 24 hours. This is a normal side effect and will disappear as the effects of the medication wear off.    REMEMBER   If you experience any difficulty breathing, chest pain, bleeding that you feel is  excessive, persistent nausea or vomiting or for any other concerns:  Call your physician Dr.  Esmond Plants, MD   at [phone] . You may also ask to have the general doctor on call paged. They are available to you 24 hours a day.      SPECIAL INSTRUCTIONS / COMMENTS   POST-OP DIAGNOSIS--SUB OPTIMAL PREP, REDUNDANT COLON, INTERNAL HEMORRHOIDS  REST TODAY-DO NOT DRIVE OR OPERATE ELECTRICAL EQUIPMENT OR SIGN LEGAL DOCUMENTS FOR 24 HOURS  RETURN TO SEE DR MULLINS AS NEEDED    Dr Esmond Plants

## 2023-04-10 NOTE — Anesthesia Preprocedure Evaluation (Signed)
ANESTHESIA PRE-OP EVALUATION  Planned Procedure: COLONOSCOPY  Review of Systems     anesthesia history negative               Pulmonary   asthma, home oxygen, rescue inhaler and current smoker,   Cardiovascular    Hypertension, Denies chest pain and hyperlipidemia ,No peripheral edema,        GI/Hepatic/Renal    NASH and liver disease        Endo/Other    obesity,   type 2 diabetes    Neuro/Psych/MS    fibromyalgia, anxiety     Cancer                        Physical Assessment      Airway       Mallampati: III    TM distance: <3 FB    Neck ROM: full  Mouth Opening: good.  No Facial hair          Dental           (+) upper dentures, lower dentures           Pulmonary    Breath sounds clear to auscultation  (-) no rhonchi, no decreased breath sounds, no wheezes, no rales and no stridor     Cardiovascular    Rhythm: regular  Rate: Normal  (-) no friction rub, carotid bruit is not present, no peripheral edema and no murmur     Other findings              Plan  ASA 3     Planned anesthesia type: general     total intravenous anesthesia                          Anesthetic plan and risks discussed with patient  signed consent obtained          Patient's NPO status is appropriate for Anesthesia.

## 2023-04-10 NOTE — Anesthesia Postprocedure Evaluation (Signed)
Anesthesia Post Op Evaluation    Patient: Melanie Ortega  Procedure(s):  COLONOSCOPY    Last Vitals:Temperature: 36.4 C (97.6 F) (04/10/23 1330)  Heart Rate: 86 (04/10/23 1330)  BP (Non-Invasive): (!) 104/56 (04/10/23 1330)  Respiratory Rate: 14 (04/10/23 1330)  SpO2: 100 % (04/10/23 1330)    No notable events documented.    Patient is sufficiently recovered from the effects of anesthesia to participate in the evaluation and has returned to their pre-procedure level.  Patient location during evaluation: PACU       Patient participation: complete - patient participated  Level of consciousness: awake and alert and responsive to verbal stimuli    Pain score: 0  Pain management: adequate  Airway patency: patent    Anesthetic complications: no  Cardiovascular status: acceptable  Respiratory status: acceptable  Hydration status: acceptable  Patient post-procedure temperature: Pt Normothermic   PONV Status: Absent

## 2024-10-01 ENCOUNTER — Other Ambulatory Visit (HOSPITAL_COMMUNITY): Payer: Self-pay

## 2024-10-01 DIAGNOSIS — L819 Disorder of pigmentation, unspecified: Secondary | ICD-10-CM

## 2024-10-01 DIAGNOSIS — M79604 Pain in right leg: Secondary | ICD-10-CM

## 2024-10-01 DIAGNOSIS — Z1231 Encounter for screening mammogram for malignant neoplasm of breast: Secondary | ICD-10-CM

## 2024-10-01 DIAGNOSIS — Z86718 Personal history of other venous thrombosis and embolism: Secondary | ICD-10-CM

## 2024-10-01 DIAGNOSIS — M7989 Other specified soft tissue disorders: Secondary | ICD-10-CM

## 2024-10-01 DIAGNOSIS — F17210 Nicotine dependence, cigarettes, uncomplicated: Secondary | ICD-10-CM

## 2024-10-04 ENCOUNTER — Ambulatory Visit: Admission: RE | Admit: 2024-10-04 | Discharge: 2024-10-04 | Disposition: A | Source: Ambulatory Visit

## 2024-10-04 ENCOUNTER — Encounter (HOSPITAL_COMMUNITY): Payer: Self-pay

## 2024-10-04 ENCOUNTER — Other Ambulatory Visit: Payer: Self-pay

## 2024-10-04 DIAGNOSIS — Z1231 Encounter for screening mammogram for malignant neoplasm of breast: Secondary | ICD-10-CM

## 2024-10-07 ENCOUNTER — Other Ambulatory Visit (HOSPITAL_COMMUNITY): Payer: Self-pay

## 2024-10-07 DIAGNOSIS — F17210 Nicotine dependence, cigarettes, uncomplicated: Secondary | ICD-10-CM

## 2024-11-17 ENCOUNTER — Encounter (INDEPENDENT_AMBULATORY_CARE_PROVIDER_SITE_OTHER): Payer: Self-pay

## 2024-11-23 ENCOUNTER — Ambulatory Visit (HOSPITAL_COMMUNITY): Admission: RE | Admit: 2024-11-23 | Discharge: 2024-11-23 | Disposition: A | Source: Ambulatory Visit

## 2024-11-23 ENCOUNTER — Ambulatory Visit
Admission: RE | Admit: 2024-11-23 | Discharge: 2024-11-23 | Disposition: A | Discharge status: Home / Self Care | Source: Ambulatory Visit

## 2024-11-23 ENCOUNTER — Other Ambulatory Visit: Payer: Self-pay

## 2024-11-23 DIAGNOSIS — M79605 Pain in left leg: Secondary | ICD-10-CM | POA: Insufficient documentation

## 2024-11-23 DIAGNOSIS — Z129 Encounter for screening for malignant neoplasm, site unspecified: Secondary | ICD-10-CM

## 2024-11-23 DIAGNOSIS — F17210 Nicotine dependence, cigarettes, uncomplicated: Secondary | ICD-10-CM

## 2024-11-23 DIAGNOSIS — M79604 Pain in right leg: Secondary | ICD-10-CM | POA: Insufficient documentation

## 2024-11-23 DIAGNOSIS — L819 Disorder of pigmentation, unspecified: Secondary | ICD-10-CM | POA: Insufficient documentation

## 2024-11-23 DIAGNOSIS — M7989 Other specified soft tissue disorders: Secondary | ICD-10-CM | POA: Insufficient documentation

## 2024-11-23 DIAGNOSIS — Z86718 Personal history of other venous thrombosis and embolism: Secondary | ICD-10-CM | POA: Insufficient documentation
